# Patient Record
Sex: Male | Born: 2000 | Hispanic: Yes | Marital: Married
Health system: Southern US, Community
[De-identification: ages and names within clinical notes are randomized; demographics above are authoritative.]

---
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
## 2021-03-07 ENCOUNTER — Emergency Department (HOSPITAL_COMMUNITY)
Admission: EM | Admit: 2021-03-07 | Discharge: 2021-03-08 | Disposition: A | Discharge status: Home / Self Care | Payer: BLUE CROSS/BLUE SHIELD | Attending: Emergency Medicine | Admitting: Emergency Medicine

## 2021-03-07 ENCOUNTER — Encounter (HOSPITAL_COMMUNITY): Payer: Self-pay

## 2021-03-07 ENCOUNTER — Other Ambulatory Visit: Payer: Self-pay

## 2021-03-07 DIAGNOSIS — R1084 Generalized abdominal pain: Secondary | ICD-10-CM | POA: Insufficient documentation

## 2021-03-07 DIAGNOSIS — Z20822 Contact with and (suspected) exposure to covid-19: Secondary | ICD-10-CM | POA: Insufficient documentation

## 2021-03-07 DIAGNOSIS — R197 Diarrhea, unspecified: Secondary | ICD-10-CM | POA: Insufficient documentation

## 2021-03-07 DIAGNOSIS — R112 Nausea with vomiting, unspecified: Secondary | ICD-10-CM | POA: Insufficient documentation

## 2021-03-07 MED ORDER — ONDANSETRON 4 MG PO TBDP
4.0000 mg | ORAL_TABLET | Freq: Once | ORAL | Status: AC
  Administered 2021-03-08: 4 mg via ORAL
  Filled 2021-03-07: qty 1

## 2021-03-07 MED ORDER — SODIUM CHLORIDE 0.9 % IV BOLUS
1000.0000 mL | Freq: Once | INTRAVENOUS | Status: AC
  Administered 2021-03-08: 1000 mL via INTRAVENOUS

## 2021-03-07 NOTE — ED Triage Notes (Signed)
Pt complains of vomiting and diarrhea since today.

## 2021-03-07 NOTE — ED Provider Triage Note (Signed)
Emergency Medicine Provider Triage Evaluation Note  Cable Fearn , a 21 y.o. male  was evaluated in triage.  Pt complains of intractable nausea, vomiting, and diarrhea.  This started abruptly when he got home from work.  Daughter has similar symptoms.  No fever or chills.  No cough or congestion.  Review of Systems  Positive:  Negative: See above   Physical Exam  BP 119/84 (BP Location: Left Arm)    Pulse (!) 106    Temp 98.2 F (36.8 C) (Oral)    Resp 17    Ht 6\' 1"  (1.854 m)    Wt 70.3 kg    SpO2 96%    BMI 20.45 kg/m  Gen:   Awake, no distress   Resp:  Normal effort  MSK:   Moves extremities without difficulty  Other:    Medical Decision Making  Medically screening exam initiated at 11:36 PM.  Appropriate orders placed.  [facility] was informed that the remainder of the evaluation will be completed by another provider, this initial triage assessment does not replace that evaluation, and the importance of remaining in the ED until their evaluation is complete.     Lucita Ferrara [place], [place] 03/07/21 2337

## 2021-03-08 LAB — RESP PANEL BY RT-PCR (FLU A&B, COVID) ARPGX2
Influenza A by PCR: NEGATIVE
Influenza B by PCR: NEGATIVE
SARS Coronavirus 2 by RT PCR: NEGATIVE

## 2021-03-08 MED ORDER — ONDANSETRON HCL 4 MG PO TABS: 4.0000 mg | ORAL_TABLET | Freq: Four times a day (QID) | ORAL | 0 refills | Status: AC

## 2021-03-08 MED ORDER — ONDANSETRON HCL 4 MG/2ML IJ SOLN
4.0000 mg | Freq: Once | INTRAMUSCULAR | Status: AC
  Administered 2021-03-08: 4 mg via INTRAVENOUS
  Filled 2021-03-08: qty 2

## 2021-03-08 NOTE — ED Provider Notes (Signed)
[facility]-EMERGENCY DEPT Provider Note   CSN: [id number] Arrival date & time: 03/07/21  2205     History  Chief Complaint  Patient presents with   Emesis   Diarrhea    Sean Sullivan is a 21 y.o. male.  HPI  21 year old male presents emergency department today for evaluation of nausea vomiting and diarrhea that started earlier tonight.  His daughter is ill with same symptoms.  He states he has some diffuse abdominal discomfort but no focal area of pain.  There is no reported fevers at home.  No reported urinary symptoms.  Denies eating any recent suspect foods.  Denies any other known sick contacts besides his daughter.  Home Medications Prior to Admission medications   Medication Sig Start Date End Date Taking? Authorizing Provider  ondansetron (ZOFRAN) 4 MG tablet Take 1 tablet (4 mg total) by mouth every 6 (six) hours. 03/08/21  Yes Elira Colasanti S, PA-C      Allergies    Patient has no known allergies.    Review of Systems   Review of Systems See HPI for pertinent positives or negatives.   Physical Exam Updated Vital Signs BP 119/84 (BP Location: Left Arm)    Pulse (!) 106    Temp 98.2 F (36.8 C) (Oral)    Resp 17    Ht 6\' 1"  (1.854 m)    Wt 70.3 kg    SpO2 96%    BMI 20.45 kg/m  Physical Exam Vitals and nursing note reviewed.  Constitutional:      General: He is not in acute distress.    Appearance: He is well-developed.  HENT:     Head: Normocephalic and atraumatic.     Mouth/Throat:     Mouth: Mucous membranes are moist.  Eyes:     Conjunctiva/sclera: Conjunctivae normal.  Cardiovascular:     Rate and Rhythm: Normal rate and regular rhythm.     Heart sounds: No murmur heard. Pulmonary:     Effort: Pulmonary effort is normal. No respiratory distress.     Breath sounds: Normal breath sounds.  Abdominal:     General: Bowel sounds are normal.     Palpations: Abdomen is soft.     Tenderness: There is no abdominal tenderness. There  is no guarding or rebound.  Musculoskeletal:        General: No swelling.     Cervical back: Neck supple.  Skin:    General: Skin is warm and dry.     Capillary Refill: Capillary refill takes less than 2 seconds.  Neurological:     Mental Status: He is alert.  Psychiatric:        Mood and Affect: Mood normal.    ED Results / Procedures / Treatments   Labs (all labs ordered are listed, but only abnormal results are displayed) Labs Reviewed  RESP PANEL BY RT-PCR (FLU A&B, COVID) ARPGX2    EKG None  Radiology No results found.  Procedures Procedures    Medications Ordered in ED Medications  sodium chloride 0.9 % bolus 1,000 mL (has no administration in time range)  ondansetron (ZOFRAN-ODT) disintegrating tablet 4 mg (4 mg Oral Given 03/08/21 0019)    ED Course/ Medical Decision Making/ A&P                           Medical Decision Making Risk Prescription drug management.   This patient presents to the ED for concern of nvd,  this involves an extensive number of treatment options, and is a complaint that carries with it a high risk of complications and morbidity.  The differential diagnosis includes but is not limited to appendicitis, bowel obstruction, bowel perforation, cholecystitis, diverticulitis  Additional history obtained: Additional history obtained from significant other Records reviewed Care Everywhere/External Records  Lab Tests: I Ordered, and personally interpreted labs.  The pertinent results include:   Covid/flu negative  Medicines ordered and prescription drug management: I ordered medication including zofran, fluids  for nausea, dehydration  Reevaluation of the patient after these medicines showed that the patient    improved  Test Considered: Do not feel that CT abd/pelvis is emergently indicated due to nonsurgical abd exam, likely viral illness in the setting of sick contact.   Critical Interventions: zofran, ivf  Complexity of problems  addressed: Patients presentation is most consistent with  acute illness/injury with systemic symptoms  Disposition: After consideration of the diagnostic results and the patients response to treatment,  I feel that the patent would benefit from discharge home with supportive care and antiemetics. Rx given. Advised on plan for f/u and strict return precautions. He voices understanding of the plan and all questions answered, pt stable for discharge.  .    Final Clinical Impression(s) / ED Diagnoses Final diagnoses:  Nausea vomiting and diarrhea    Rx / DC Orders ED Discharge Orders          Ordered    ondansetron (ZOFRAN) 4 MG tablet  Every 6 hours        03/08/21 0106              Karrie Meres, PA-C 03/08/21 0109    Tilden Fossa, MD 03/08/21 [phone]

## 2021-03-08 NOTE — Discharge Instructions (Addendum)
You were given a prescription for zofran to help with your nausea. Please take as directed.  Please follow up with your primary doctor within the next 5-7 days.  If you do not have a primary care provider, information for a healthcare clinic has been provided for you to make arrangements for follow up care. Please return to the ER sooner if you have any new or worsening symptoms, or if you have any of the following symptoms:  Abdominal pain that does not go away.  You have a fever.  You keep throwing up (vomiting).  The pain is felt only in portions of the abdomen. Pain in the right side could possibly be appendicitis. In an adult, pain in the left lower portion of the abdomen could be colitis or diverticulitis.  You pass bloody or black tarry stools.  There is bright red blood in the stool.  The constipation stays for more than 4 days.  There is belly (abdominal) or rectal pain.  You do not seem to be getting better.  You have any questions or concerns.   

## 2022-12-23 ENCOUNTER — Encounter (HOSPITAL_COMMUNITY): Payer: Self-pay | Admitting: Emergency Medicine

## 2022-12-23 ENCOUNTER — Ambulatory Visit (HOSPITAL_COMMUNITY)
Admission: EM | Admit: 2022-12-23 | Discharge: 2022-12-23 | Disposition: A | Discharge status: Home / Self Care | Payer: Medicaid Other | Attending: Family Medicine | Admitting: Family Medicine

## 2022-12-23 ENCOUNTER — Emergency Department (HOSPITAL_COMMUNITY)
Admission: EM | Admit: 2022-12-23 | Discharge: 2022-12-23 | Disposition: A | Discharge status: Home / Self Care | Payer: Medicaid Other

## 2022-12-23 ENCOUNTER — Encounter (HOSPITAL_COMMUNITY): Payer: Self-pay

## 2022-12-23 DIAGNOSIS — L0231 Cutaneous abscess of buttock: Secondary | ICD-10-CM | POA: Diagnosis present

## 2022-12-23 DIAGNOSIS — L0501 Pilonidal cyst with abscess: Secondary | ICD-10-CM | POA: Diagnosis not present

## 2022-12-23 DIAGNOSIS — K611 Rectal abscess: Secondary | ICD-10-CM | POA: Diagnosis not present

## 2022-12-23 MED ORDER — DOXYCYCLINE HYCLATE 100 MG PO CAPS: 100.0000 mg | ORAL_CAPSULE | Freq: Two times a day (BID) | ORAL | 0 refills | Status: AC

## 2022-12-23 MED ORDER — LIDOCAINE-EPINEPHRINE (PF) 2 %-1:200000 IJ SOLN
20.0000 mL | Freq: Once | INTRAMUSCULAR | Status: AC
  Administered 2022-12-23: 20 mL
  Filled 2022-12-23: qty 20

## 2022-12-23 MED ORDER — OXYCODONE-ACETAMINOPHEN 5-325 MG PO TABS
2.0000 | ORAL_TABLET | Freq: Once | ORAL | Status: AC
  Administered 2022-12-23: 2 via ORAL
  Filled 2022-12-23: qty 2

## 2022-12-23 NOTE — Discharge Instructions (Addendum)
Thank you for allowing [place] to be a part of your care today.  You have a pilonidal cyst with abscess that was drained today.  A packing material was left inside of the incision site.  Leave this in for the next 48 hours and then remove.  If it falls out on its own, this is okay.  After the packing is removed, you may continue to do warm compresses or sitz bath's to help encourage the area to drain.  Take 600-800 mg of ibuprofen every 6-8 hours as needed for pain.  You may alternate this every 4 hours with [phone] mg of Tylenol if needed.  Do not exceed 3200 mg of ibuprofen or 4000 mg of Tylenol in a 24-hour period.  I am sending you home on an antibiotic to take for the next 7 days to help resolve any remaining infection.  Take as prescribed.  Remain sitting upright at least 30-45 minutes after taking this medication to avoid irritating your esophagus.    Return to the ED if you develop sudden worsening of your symptoms, develop fever, or if you have new concerns.

## 2022-12-23 NOTE — ED Triage Notes (Signed)
Pt presents with cyst of buttocks x 3 days. Denies any drainage or odor at this time.

## 2022-12-23 NOTE — ED Provider Notes (Signed)
[facility] AT [facility] Provider Note   CSN: [id number] Arrival date & time: 12/23/22  1821     History  Chief Complaint  Patient presents with   Abscess    Sean Sullivan is a 22 y.o. male without significant past medical history presents from urgent care complaining of an abscess to his buttock.  Patient states it began 2 days ago, and has become more painful and he is unable to sit down properly.  Patient states that it starts at the top of his "butt crack".  He has been using warm compresses and hot baths without spontaneous drainage.  He has never had an abscess or cyst in this area prior.  Denies fever, difficulty defecating.       Home Medications Prior to Admission medications   Medication Sig Start Date End Date Taking? Authorizing Provider  doxycycline (VIBRAMYCIN) 100 MG capsule Take 1 capsule (100 mg total) by mouth 2 (two) times daily for 7 days. 12/23/22 12/30/22 Yes Anasha Perfecto R, PA-C  ondansetron (ZOFRAN) 4 MG tablet Take 1 tablet (4 mg total) by mouth every 6 (six) hours. 03/08/21   Couture, Cortni S, PA-C      Allergies    Patient has no known allergies.    Review of Systems   Review of Systems  Constitutional:  Negative for fever.  Skin:        Abscess at gluteal cleft    Physical Exam Updated Vital Signs BP 104/71   Pulse 81   Temp 99.1 F (37.3 C)   Resp 18   SpO2 100%  Physical Exam Vitals and nursing note reviewed.  Constitutional:      General: He is not in acute distress.    Appearance: Normal appearance. He is not ill-appearing or diaphoretic.  Cardiovascular:     Rate and Rhythm: Normal rate and regular rhythm.  Pulmonary:     Effort: Pulmonary effort is normal.  Skin:    General: Skin is warm and dry.     Capillary Refill: Capillary refill takes less than 2 seconds.       Neurological:     Mental Status: He is alert. Mental status is at baseline.  Psychiatric:        Mood and Affect: Mood  normal.        Behavior: Behavior normal.     ED Results / Procedures / Treatments   Labs (all labs ordered are listed, but only abnormal results are displayed) Labs Reviewed - No data to display  EKG None  Radiology No results found.  Procedures .Incision and Drainage  Date/Time: 12/23/2022 8:07 PM  Performed by: Lenard Simmer, PA-C Authorized by: Lenard Simmer, PA-C   Consent:    Consent obtained:  Verbal   Consent given by:  Patient   Risks, benefits, and alternatives were discussed: yes     Risks discussed:  Bleeding, incomplete drainage, pain and infection   Alternatives discussed:  No treatment, delayed treatment, alternative treatment and observation Universal protocol:    Procedure explained and questions answered to patient or proxy's satisfaction: yes     Patient identity confirmed:  Verbally with patient Location:    Type:  Abscess   Size:  5.5 cm   Location:  Anogenital   Anogenital location:  Pilonidal Pre-procedure details:    Skin preparation:  Chlorhexidine with alcohol Sedation:    Sedation type:  None Anesthesia:    Anesthesia method:  Local infiltration  Local anesthetic:  Lidocaine 2% WITH epi Procedure type:    Complexity:  Complex Procedure details:    Ultrasound guidance: no     Needle aspiration: yes     Needle size:  18 G   Incision types:  Single straight   Incision depth:  Dermal   Wound management:  Probed and deloculated and irrigated with saline   Drainage:  Bloody and purulent   Drainage amount:  Copious   Wound treatment:  Wound left open   Packing materials:  1/4 in iodoform gauze   Amount 1/4" iodoform:  4 inches Post-procedure details:    Procedure completion:  Tolerated well, no immediate complications     Medications Ordered in ED Medications  oxyCODONE-acetaminophen (PERCOCET/ROXICET) 5-325 MG per tablet 2 tablet (2 tablets Oral Given 12/23/22 1917)  lidocaine-EPINEPHrine (XYLOCAINE W/EPI) 2 %-1:200000 (PF)  injection 20 mL (20 mLs Infiltration Given by Other 12/23/22 1956)    ED Course/ Medical Decision Making/ A&P                                 Medical Decision Making Risk Prescription drug management.   This patient presents to the ED with chief complaint(s) of abscess to buttock region.  The complaint involves an extensive differential diagnosis and also carries with it a high risk of complications and morbidity.    The differential diagnosis includes pilonidal cyst with abscess, perirectal abscess, perianal fistula    Initial Assessment:   Abscess that starts at the gluteal cleft and extends inferiorly towards the anus approximately 5-6 cm in length.  There does not appear to be any anus involvement to suggest rectal abscess or fistula.  Fluctuance palpable.  Area is exquisitely tender.  Findings are consistent with a pilonidal cyst abscess.    Treatment and Reassessment: Patient given 10 mg oxycodone for pain relief.  I&D performed without complication.  See procedure section for more detail.    Disposition:   Advised patient he can remove wound packing in 48 hours or he may return to the ED to have this removed if he does not feel comfortable doing so.  Recommended Tylenol and ibuprofen for pain.  Will send patient home on 7 days of doxycycline to resolve any remaining infection or cellulitis.    The patient has been appropriately medically screened and/or stabilized in the ED. I have low suspicion for any other emergent medical condition which would require further screening, evaluation or treatment in the ED or require inpatient management. At time of discharge the patient is hemodynamically stable and in no acute distress. I have discussed work-up results and diagnosis with patient and answered all questions. Patient is agreeable with discharge plan. We discussed strict return precautions for returning to the emergency department and they verbalized understanding.              Final Clinical Impression(s) / ED Diagnoses Final diagnoses:  Pilonidal cyst with abscess    Rx / DC Orders ED Discharge Orders          Ordered    doxycycline (VIBRAMYCIN) 100 MG capsule  2 times daily        12/23/22 2007              Barrie Lyme 12/23/22 2009    Durwin Glaze, MD 12/23/22 2054

## 2022-12-23 NOTE — ED Notes (Signed)
Patient is being discharged from the Urgent Care and sent to the Emergency Department via POV . Per Mardella Layman, patient is in need of higher level of care due to Perirectal Abscess. Patient is aware and verbalizes understanding of plan of care.  Vitals:   12/23/22 1802  BP: (!) 107/58  Pulse: 90  Resp: 16  Temp: 98.7 F (37.1 C)  SpO2: 98%

## 2022-12-23 NOTE — ED Triage Notes (Signed)
Pt here sent down from UC with c/o abscess on the buttock , states that it started 2 days ago , hurts to sit

## 2022-12-25 NOTE — ED Provider Notes (Signed)
  [facility]   [id number] 12/23/22 Arrival Time: 1732  ASSESSMENT & PLAN:  1. Peri-rectal abscess    After discussion regarding needed I&D and pain control, he elects ED evaluation. Abscess is close to rectum. Discharged to ED; stable; via POV.  Reviewed expectations re: course of current medical issues. Questions answered. Outlined signs and symptoms indicating need for more acute intervention. Patient verbalized understanding. After Visit Summary given.   SUBJECTIVE:  Sean Sullivan is a 22 y.o. male who presents with a possible infection of his buttock. Approx 3 days. Painful. Denies any drainage or odor at this time.  Denies fever.  OBJECTIVE:  Vitals:   12/23/22 1802  BP: (!) 107/58  Pulse: 90  Resp: 16  Temp: 98.7 F (37.1 C)  TempSrc: Oral  SpO2: 98%     General appearance: alert; no distress Buttock: approx 2x3 cm induration of his R mid buttock extending toward rectum; tender to touch; no active drainage or bleeding Psychological: alert and cooperative; normal mood and affect  No Known Allergies  History reviewed. No pertinent past medical history. Social History   Socioeconomic History   Marital status: Married    Spouse name: Not on file   Number of children: Not on file   Years of education: Not on file   Highest education level: Not on file  Occupational History   Not on file  Tobacco Use   Smoking status: Passive Smoke Exposure - Never Smoker   Smokeless tobacco: Never  Substance and Sexual Activity   Alcohol use: Not on file   Drug use: Yes    Types: Marijuana    Comment: daily   Sexual activity: Not on file  Other Topics Concern   Not on file  Social History Narrative   Not on file   Social Determinants of Health   Financial Resource Strain: Not on file  Food Insecurity: Not on file  Transportation Needs: Not on file  Physical Activity: Not on file  Stress: Not on file  Social Connections: Not on file   History  reviewed. No pertinent family history. History reviewed. No pertinent surgical history.          Mardella Layman, MD 12/25/22 1023
